# Patient Record
Sex: Female | Born: 1995 | Race: White | Hispanic: No | Marital: Single | Smoking: Never smoker
Health system: Southern US, Community
[De-identification: ages and names within clinical notes are randomized; demographics above are authoritative.]

---

## 2017-09-26 ENCOUNTER — Emergency Department (INDEPENDENT_AMBULATORY_CARE_PROVIDER_SITE_OTHER)
Admission: EM | Admit: 2017-09-26 | Discharge: 2017-09-26 | Disposition: A | Discharge status: Home / Self Care | Payer: BLUE CROSS/BLUE SHIELD | Source: Home / Self Care | Attending: Family Medicine | Admitting: Family Medicine

## 2017-09-26 ENCOUNTER — Emergency Department (INDEPENDENT_AMBULATORY_CARE_PROVIDER_SITE_OTHER): Payer: BLUE CROSS/BLUE SHIELD

## 2017-09-26 ENCOUNTER — Encounter: Payer: Self-pay | Admitting: Emergency Medicine

## 2017-09-26 DIAGNOSIS — M62838 Other muscle spasm: Secondary | ICD-10-CM | POA: Diagnosis not present

## 2017-09-26 DIAGNOSIS — M542 Cervicalgia: Secondary | ICD-10-CM

## 2017-09-26 MED ORDER — CYCLOBENZAPRINE HCL 10 MG PO TABS: ORAL_TABLET | ORAL | 0 refills | Status: AC

## 2017-09-26 MED ORDER — NAPROXEN 500 MG PO TABS: ORAL_TABLET | ORAL | 1 refills | Status: AC

## 2017-09-26 NOTE — Discharge Instructions (Addendum)
Apply ice pack for 20 to 30 minutes, 3 to 4 times daily  Continue until pain and swelling decrease.  Begin range of motion and stretching exercises as tolerated. 

## 2017-09-26 NOTE — ED Provider Notes (Signed)
[facility]    CSN: [id number] Arrival date & time: 09/26/17  1330     History   Chief Complaint Chief Complaint  Patient presents with  . Neck Pain    HPI Ruth Freeman is a 22 y.o. female.   Patient awoke one week ago with right neck pain that has persisted.  She recalls no injury, and no recent change in her activities.  No paresthesias.  Ibuprofen has not been helpful.   The history is provided by the patient.    History reviewed. No pertinent past medical history.  There are no active problems to display for this patient.   History reviewed. No pertinent surgical history.  OB History    No data available       Home Medications    Prior to Admission medications   Medication Sig Start Date End Date Taking? Authorizing Provider  cyclobenzaprine (FLEXERIL) 10 MG tablet Take one tab by mouth 2 or 3 times daily for muscle spasm 09/26/17   Lattie Haw, MD  naproxen (NAPROSYN) 500 MG tablet Take two tabs by mouth twice daily with food. 09/26/17   Lattie Haw, MD    Family History History reviewed. No pertinent family history.  Social History Social History   Tobacco Use  . Smoking status: Never Smoker  . Smokeless tobacco: Never Used  Substance Use Topics  . Alcohol use: No    Frequency: Never  . Drug use: Not on file     Allergies   Sulfa antibiotics   Review of Systems Review of Systems  Constitutional: Negative for activity change, chills, diaphoresis, fatigue and fever.  HENT: Negative.   Eyes: Negative.   Respiratory: Negative.   Cardiovascular: Negative.   Gastrointestinal: Negative.   Genitourinary: Negative.   Musculoskeletal: Positive for neck pain and neck stiffness.  Skin: Negative for rash.  Neurological: Negative.      Physical Exam Triage Vital Signs ED Triage Vitals  Enc Vitals Group     BP 09/26/17 1426 116/77     Pulse Rate 09/26/17 1426 82     Resp --      Temp 09/26/17 1426 98.6 F (37 C)       Temp Source 09/26/17 1426 Oral     SpO2 09/26/17 1426 98 %     Weight 09/26/17 1427 (!) 340 lb (154.2 kg)     Height --      Head Circumference --      Peak Flow --      Pain Score 09/26/17 1426 0     Pain Loc --      Pain Edu? --      Excl. in GC? --    No data found.  Updated Vital Signs BP 116/77 (BP Location: Right Arm)   Pulse 82   Temp 98.6 F (37 C) (Oral)   Wt (!) 340 lb (154.2 kg)   LMP 09/15/2017   SpO2 98%   Visual Acuity Right Eye Distance:   Left Eye Distance:   Bilateral Distance:    Right Eye Near:   Left Eye Near:    Bilateral Near:     Physical Exam  Constitutional: She appears well-developed and well-nourished. No distress.  HENT:  Head: Atraumatic.  Right Ear: External ear normal.  Left Ear: External ear normal.  Nose: Nose normal.  Mouth/Throat: Oropharynx is clear and moist.  Eyes: Pupils are equal, round, and reactive to light.  Neck: Neck supple. Muscular tenderness present. No  spinous process tenderness present. No neck rigidity. Decreased range of motion present. No erythema present. No thyromegaly present.    Patient's right neck pain localized as noted on diagram.  However, there is no tenderness to palpation at this location.  Cardiovascular: Normal heart sounds.  Lymphadenopathy:    She has no cervical adenopathy.  Neurological: She is alert.  Skin: Skin is warm and dry.  Nursing note and vitals reviewed.    UC Treatments / Results  Labs (all labs ordered are listed, but only abnormal results are displayed) Labs Reviewed - No data to display  EKG  EKG Interpretation None       Radiology Dg Cervical Spine Complete  Result Date: 09/26/2017 CLINICAL DATA:  Neck pain for 1 week, no known injury, initial encounter EXAM: CERVICAL SPINE - COMPLETE 4+ VIEW COMPARISON:  None. FINDINGS: There is no evidence of cervical spine fracture or prevertebral soft tissue swelling. Alignment is normal. No other significant bone  abnormalities are identified. IMPRESSION: No acute abnormality noted. Electronically Signed   By: Alcide CleverMark  Lukens M.D.   On: 09/26/2017 15:24    Procedures Procedures (including critical care time)  Medications Ordered in UC Medications - No data to display   Initial Impression / Assessment and Plan / UC Course  I have reviewed the triage vital signs and the nursing notes.  Pertinent labs & imaging results that were available during my care of the patient were reviewed by me and considered in my medical decision making (see chart for details).    Begin naproxen 500mg  BID, and Flexeril 20mg  TID.  Apply ice pack for 20 to 30 minutes, 3 to 4 times daily  Continue until pain and swelling decrease.  Begin range of motion and stretching exercises as tolerated. Followup with Dr. Rodney Langtonhomas Thekkekandam or Dr. Clementeen GrahamEvan Corey ([facility]) if not improving about two weeks.  Final Clinical Impressions(s) / UC Diagnoses   Final diagnoses:  Neck muscle spasm    ED Discharge Orders        Ordered    naproxen (NAPROSYN) 500 MG tablet     09/26/17 1540    cyclobenzaprine (FLEXERIL) 10 MG tablet     09/26/17 1540          Lattie Haw, MD 09/29/17 1623

## 2017-09-26 NOTE — ED Triage Notes (Signed)
Pt c/o right sided neck pain after sleeping on it wrong about 1 week ago. States motrin does not help pain.

## 2019-03-24 IMAGING — DX DG CERVICAL SPINE COMPLETE 4+V
6 series · 6 of 6 positions shown · non-contrast
Comparison: None.

CLINICAL DATA: Neck pain for 1 week, no known injury, initial
encounter

EXAM:
CERVICAL SPINE - COMPLETE 4+ VIEW

[c-spine lat]
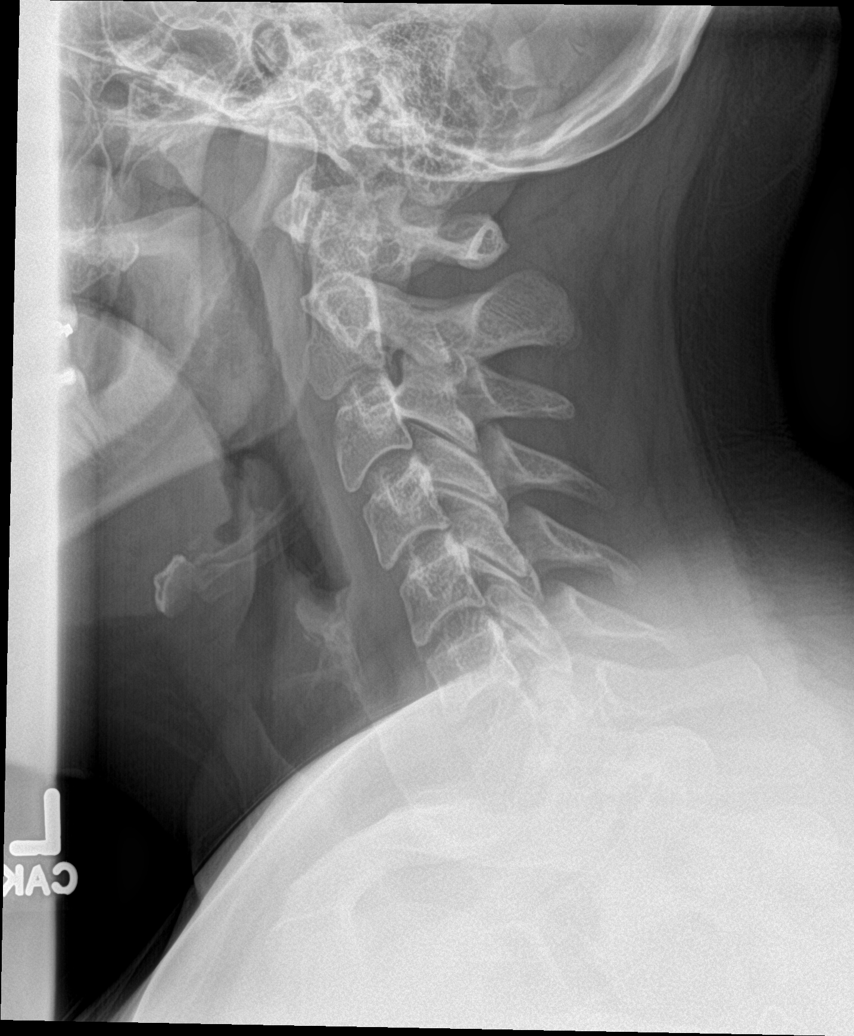

[c-spine obl (1 of 2)]
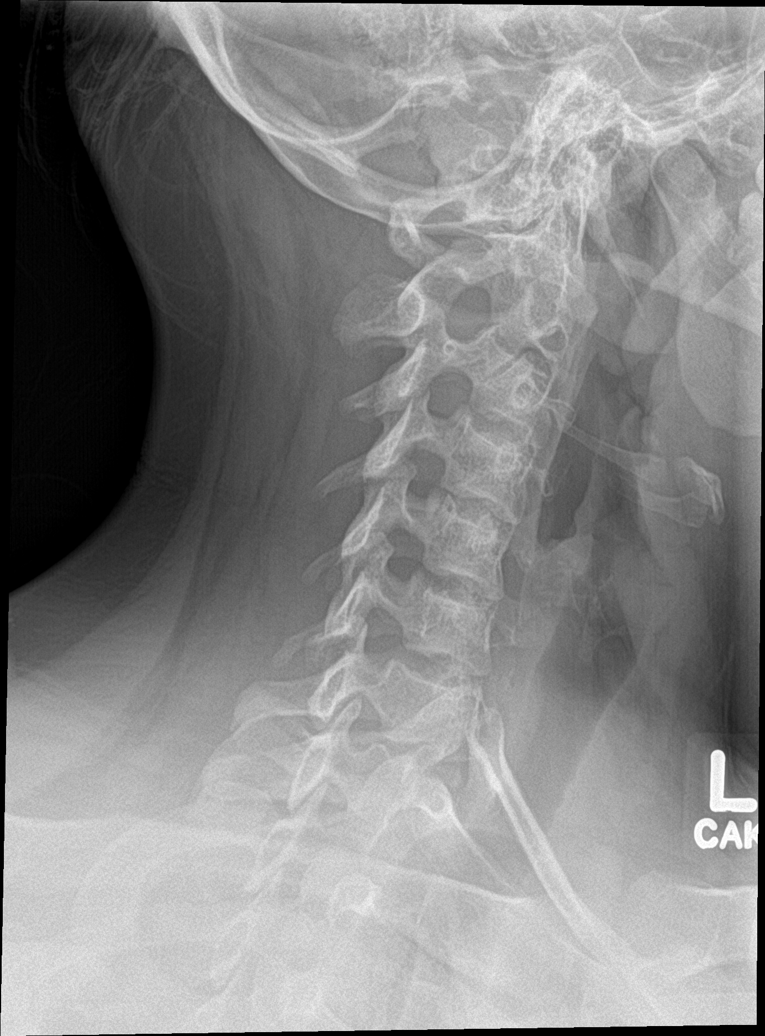

[c-spine obl (2 of 2)]
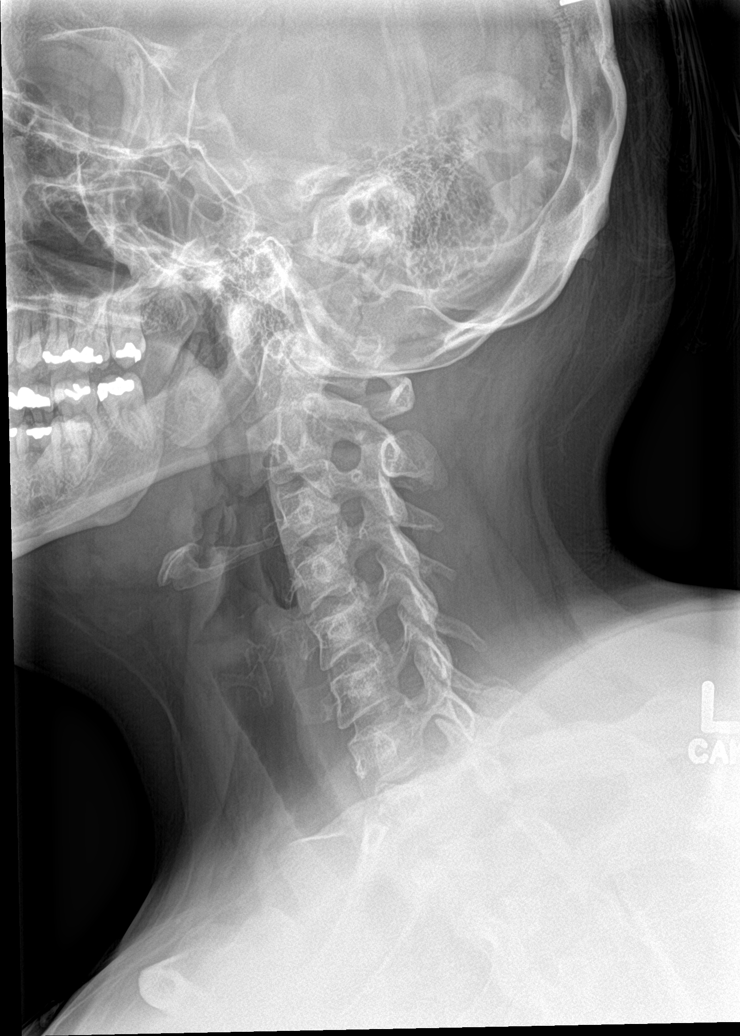

[c-spine ap]
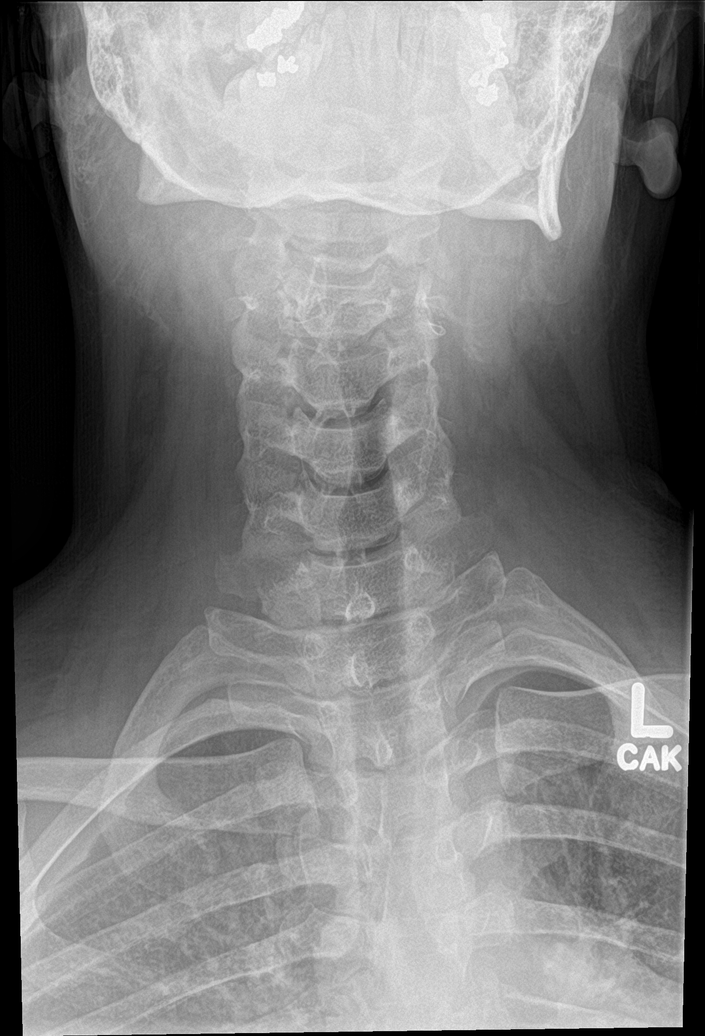

[c-spine open mouth]
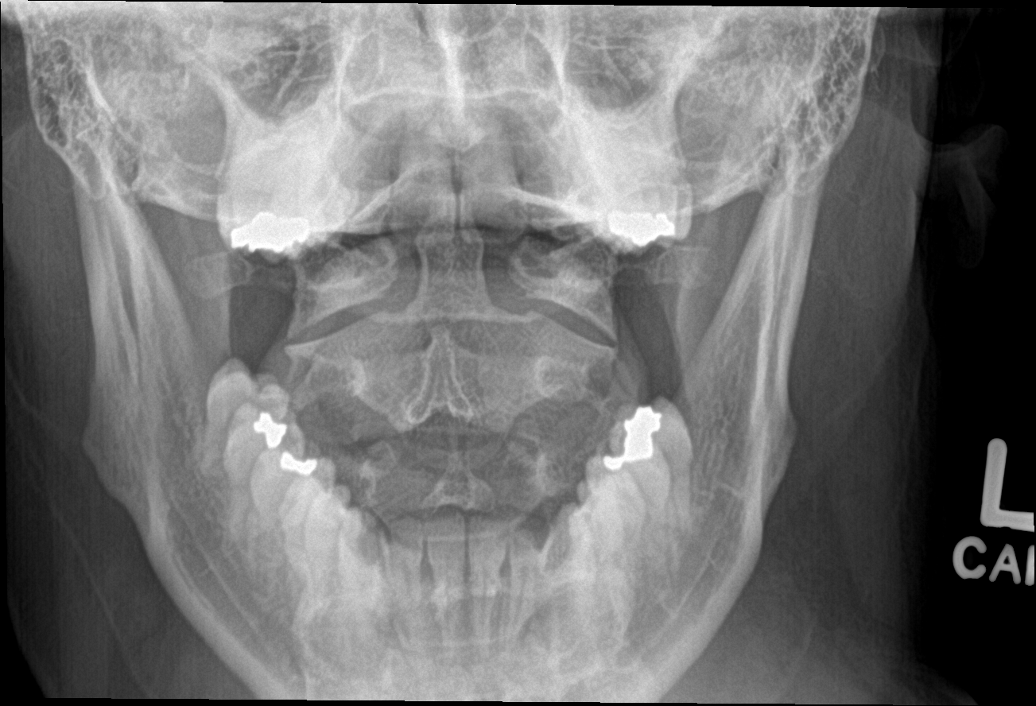

[c-spine swimmers]
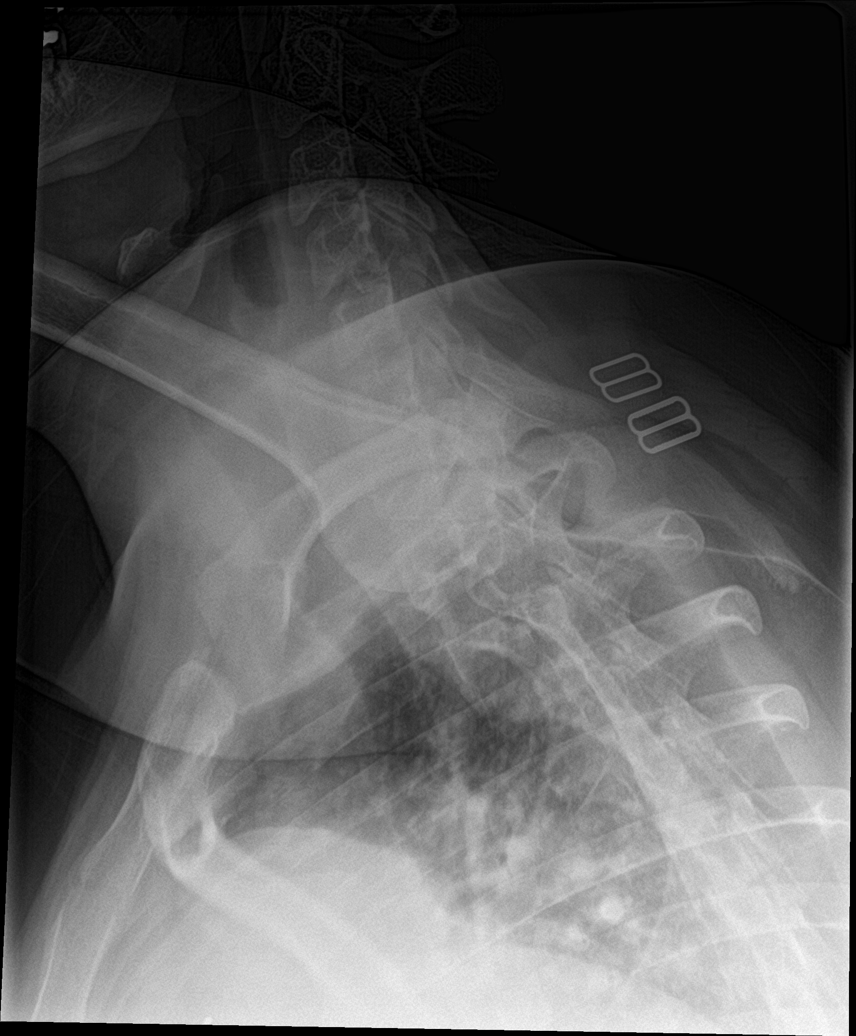

[6 of 6 positions shown; findings below may reference images not displayed]

FINDINGS: There is no evidence of cervical spine fracture or prevertebral soft
tissue swelling. Alignment is normal. No other significant bone
abnormalities are identified.
IMPRESSION: No acute abnormality noted.
